# Patient Record
Sex: Female | Born: 1980 | Race: White | Hispanic: Yes | Marital: Single | Smoking: Never smoker
Health system: Southern US, Community
[De-identification: ages and names within clinical notes are randomized; demographics above are authoritative.]

---

## 2008-11-23 ENCOUNTER — Emergency Department (HOSPITAL_COMMUNITY): Admission: EM | Admit: 2008-11-23 | Discharge: 2008-11-23 | Payer: Self-pay | Admitting: Emergency Medicine

## 2008-11-28 ENCOUNTER — Emergency Department (HOSPITAL_COMMUNITY): Admission: EM | Admit: 2008-11-28 | Discharge: 2008-11-28 | Payer: Self-pay | Admitting: Emergency Medicine

## 2008-12-02 ENCOUNTER — Emergency Department (HOSPITAL_COMMUNITY): Admission: EM | Admit: 2008-12-02 | Discharge: 2008-12-02 | Payer: Self-pay | Admitting: Emergency Medicine

## 2008-12-25 ENCOUNTER — Emergency Department (HOSPITAL_COMMUNITY): Admission: EM | Admit: 2008-12-25 | Discharge: 2008-12-25 | Payer: Self-pay | Admitting: Emergency Medicine

## 2009-03-17 ENCOUNTER — Emergency Department (HOSPITAL_COMMUNITY): Admission: EM | Admit: 2009-03-17 | Discharge: 2009-03-17 | Payer: Self-pay | Admitting: Emergency Medicine

## 2009-07-16 ENCOUNTER — Emergency Department (HOSPITAL_COMMUNITY): Admission: EM | Admit: 2009-07-16 | Discharge: 2009-07-16 | Payer: Self-pay | Admitting: Emergency Medicine

## 2009-09-10 ENCOUNTER — Emergency Department (HOSPITAL_COMMUNITY): Admission: EM | Admit: 2009-09-10 | Discharge: 2009-09-10 | Payer: Self-pay | Admitting: Emergency Medicine
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
## 2011-01-22 LAB — DIFFERENTIAL
Basophils Absolute: 0 10*3/uL (ref 0.0–0.1)
Basophils Relative: 0 % (ref 0–1)
Eosinophils Absolute: 0.2 10*3/uL (ref 0.0–0.7)
Monocytes Relative: 8 % (ref 3–12)
Neutrophils Relative %: 46 % (ref 43–77)

## 2011-01-22 LAB — CBC
Hemoglobin: 14.2 g/dL (ref 12.0–15.0)
RBC: 4.59 MIL/uL (ref 3.87–5.11)

## 2011-01-22 LAB — COMPREHENSIVE METABOLIC PANEL
ALT: 19 U/L (ref 0–35)
Alkaline Phosphatase: 61 U/L (ref 39–117)
CO2: 27 mEq/L (ref 19–32)
Chloride: 102 mEq/L (ref 96–112)
GFR calc non Af Amer: >60 mL/min (ref >60)
Glucose, Bld: 100 mg/dL — ABNORMAL HIGH (ref 70–99)
Potassium: 3.8 mEq/L (ref 3.5–5.1)
Sodium: 139 mEq/L (ref 135–145)
Total Bilirubin: 0.5 mg/dL (ref 0.3–1.2)

## 2011-01-22 LAB — URINALYSIS, ROUTINE W REFLEX MICROSCOPIC
Glucose, UA: NEGATIVE mg/dL
Ketones, ur: NEGATIVE mg/dL
Protein, ur: NEGATIVE mg/dL

## 2011-01-22 LAB — LIPASE, BLOOD: Lipase: 31 U/L (ref 11–59)

## 2011-01-22 LAB — POCT PREGNANCY, URINE: Preg Test, Ur: NEGATIVE

## 2011-01-26 LAB — HEPATIC FUNCTION PANEL
ALT: 17 U/L (ref 0–35)
Albumin: 3.9 g/dL (ref 3.5–5.2)
Bilirubin, Direct: 0.1 mg/dL (ref 0.0–0.3)
Indirect Bilirubin: 0.4 mg/dL (ref 0.3–0.9)
Total Bilirubin: 0.5 mg/dL (ref 0.3–1.2)
Total Protein: 7.2 g/dL (ref 6.0–8.3)

## 2011-01-26 LAB — URINALYSIS, ROUTINE W REFLEX MICROSCOPIC
Bilirubin Urine: NEGATIVE
Nitrite: NEGATIVE
Specific Gravity, Urine: 1.02 (ref 1.005–1.030)
pH: 6.5 (ref 5.0–8.0)

## 2011-01-26 LAB — POCT I-STAT, CHEM 8
Chloride: 104 mEq/L (ref 96–112)
Glucose, Bld: 105 mg/dL — ABNORMAL HIGH (ref 70–99)
HCT: 42 % (ref 36.0–46.0)
Hemoglobin: 14.3 g/dL (ref 12.0–15.0)
Potassium: 3.7 mEq/L (ref 3.5–5.1)
Sodium: 138 mEq/L (ref 135–145)

## 2011-01-26 LAB — DIFFERENTIAL
Lymphocytes Relative: 38 % (ref 12–46)
Lymphs Abs: 4.2 10*3/uL — ABNORMAL HIGH (ref 0.7–4.0)
Monocytes Relative: 6 % (ref 3–12)
Neutro Abs: 6.1 10*3/uL (ref 1.7–7.7)
Neutrophils Relative %: 55 % (ref 43–77)

## 2011-01-26 LAB — CBC
RBC: 4.52 MIL/uL (ref 3.87–5.11)
WBC: 11.2 10*3/uL — ABNORMAL HIGH (ref 4.0–10.5)

## 2011-01-28 LAB — CBC
Hemoglobin: 12.8 g/dL (ref 12.0–15.0)
RDW: 13.6 % (ref 11.5–15.5)
WBC: 8.2 10*3/uL (ref 4.0–10.5)

## 2011-01-28 LAB — COMPREHENSIVE METABOLIC PANEL
ALT: 25 U/L (ref 0–35)
Albumin: 4 g/dL (ref 3.5–5.2)
Alkaline Phosphatase: 55 U/L (ref 39–117)
Chloride: 107 mEq/L (ref 96–112)
Glucose, Bld: 109 mg/dL — ABNORMAL HIGH (ref 70–99)
Potassium: 3.8 mEq/L (ref 3.5–5.1)
Sodium: 137 mEq/L (ref 135–145)
Total Bilirubin: 0.5 mg/dL (ref 0.3–1.2)
Total Protein: 6.4 g/dL (ref 6.0–8.3)

## 2011-01-28 LAB — URINALYSIS, ROUTINE W REFLEX MICROSCOPIC
Bilirubin Urine: NEGATIVE
Glucose, UA: NEGATIVE mg/dL
Ketones, ur: NEGATIVE mg/dL
pH: 6 (ref 5.0–8.0)

## 2011-01-28 LAB — DIFFERENTIAL
Basophils Relative: 0 % (ref 0–1)
Eosinophils Absolute: 0.1 10*3/uL (ref 0.0–0.7)
Monocytes Absolute: 0.4 10*3/uL (ref 0.1–1.0)
Monocytes Relative: 5 % (ref 3–12)
Neutrophils Relative %: 65 % (ref 43–77)

## 2011-02-02 LAB — LIPASE, BLOOD: Lipase: 28 U/L (ref 11–59)

## 2011-02-02 LAB — COMPREHENSIVE METABOLIC PANEL
BUN: 10 mg/dL (ref 6–23)
CO2: 23 mEq/L (ref 19–32)
Chloride: 102 mEq/L (ref 96–112)
Creatinine, Ser: 0.66 mg/dL (ref 0.4–1.2)
GFR calc non Af Amer: >60 mL/min (ref >60)
Glucose, Bld: 130 mg/dL — ABNORMAL HIGH (ref 70–99)
Total Bilirubin: 0.6 mg/dL (ref 0.3–1.2)

## 2011-02-02 LAB — DIFFERENTIAL
Basophils Absolute: 0.2 10*3/uL — ABNORMAL HIGH (ref 0.0–0.1)
Eosinophils Relative: 3 % (ref 0–5)
Lymphocytes Relative: 36 % (ref 12–46)
Neutro Abs: 6.4 10*3/uL (ref 1.7–7.7)

## 2011-02-02 LAB — CBC
HCT: 37.5 % (ref 36.0–46.0)
MCV: 89 fL (ref 78.0–100.0)
RBC: 4.21 MIL/uL (ref 3.87–5.11)
WBC: 11.7 10*3/uL — ABNORMAL HIGH (ref 4.0–10.5)

## 2011-02-02 LAB — URINALYSIS, ROUTINE W REFLEX MICROSCOPIC
Bilirubin Urine: NEGATIVE
Nitrite: NEGATIVE
Specific Gravity, Urine: >1.03 — ABNORMAL HIGH (ref 1.005–1.030)
Urobilinogen, UA: 0.2 mg/dL (ref 0.0–1.0)

## 2011-02-02 LAB — URINE MICROSCOPIC-ADD ON

## 2011-02-02 LAB — PREGNANCY, URINE: Preg Test, Ur: NEGATIVE

## 2014-02-25 ENCOUNTER — Observation Stay (HOSPITAL_COMMUNITY)
Admission: EM | Admit: 2014-02-25 | Discharge: 2014-02-27 | Disposition: A | Discharge status: Home / Self Care | Payer: Self-pay | Attending: General Surgery | Admitting: General Surgery

## 2014-02-25 ENCOUNTER — Emergency Department (HOSPITAL_COMMUNITY): Payer: Self-pay

## 2014-02-25 ENCOUNTER — Encounter (HOSPITAL_COMMUNITY): Payer: Self-pay | Admitting: Emergency Medicine

## 2014-02-25 DIAGNOSIS — K8020 Calculus of gallbladder without cholecystitis without obstruction: Principal | ICD-10-CM | POA: Insufficient documentation

## 2014-02-25 DIAGNOSIS — K8050 Calculus of bile duct without cholangitis or cholecystitis without obstruction: Secondary | ICD-10-CM | POA: Diagnosis present

## 2014-02-25 DIAGNOSIS — R109 Unspecified abdominal pain: Secondary | ICD-10-CM

## 2014-02-25 LAB — CBC WITH DIFFERENTIAL/PLATELET
Basophils Absolute: 0 10*3/uL (ref 0.0–0.1)
Basophils Relative: 0 % (ref 0–1)
Eosinophils Absolute: 0.1 10*3/uL (ref 0.0–0.7)
Eosinophils Relative: 1 % (ref 0–5)
HEMATOCRIT: 39.3 % (ref 36.0–46.0)
HEMOGLOBIN: 13.5 g/dL (ref 12.0–15.0)
LYMPHS ABS: 3.5 10*3/uL (ref 0.7–4.0)
LYMPHS PCT: 32 % (ref 12–46)
MCH: 30.5 pg (ref 26.0–34.0)
MCHC: 34.4 g/dL (ref 30.0–36.0)
MCV: 88.9 fL (ref 78.0–100.0)
MONO ABS: 0.5 10*3/uL (ref 0.1–1.0)
MONOS PCT: 5 % (ref 3–12)
NEUTROS ABS: 6.9 10*3/uL (ref 1.7–7.7)
Neutrophils Relative %: 62 % (ref 43–77)
Platelets: 362 10*3/uL (ref 150–400)
RBC: 4.42 MIL/uL (ref 3.87–5.11)
RDW: 13.6 % (ref 11.5–15.5)
WBC: 11.1 10*3/uL — AB (ref 4.0–10.5)

## 2014-02-25 LAB — COMPREHENSIVE METABOLIC PANEL
ALBUMIN: 4.4 g/dL (ref 3.5–5.2)
ALT: 14 U/L (ref 0–35)
AST: 21 U/L (ref 0–37)
Alkaline Phosphatase: 74 U/L (ref 39–117)
BUN: 18 mg/dL (ref 6–23)
CALCIUM: 9.4 mg/dL (ref 8.4–10.5)
CO2: 26 mEq/L (ref 19–32)
Chloride: 102 mEq/L (ref 96–112)
Creatinine, Ser: 0.78 mg/dL (ref 0.50–1.10)
GFR calc non Af Amer: >90 mL/min (ref >90)
GLUCOSE: 108 mg/dL — AB (ref 70–99)
Potassium: 3.9 mEq/L (ref 3.7–5.3)
SODIUM: 141 meq/L (ref 137–147)
TOTAL PROTEIN: 7.8 g/dL (ref 6.0–8.3)
Total Bilirubin: <0.2 mg/dL — ABNORMAL LOW (ref 0.3–1.2)

## 2014-02-25 LAB — LIPASE, BLOOD: Lipase: 22 U/L (ref 11–59)

## 2014-02-25 LAB — POC URINE PREG, ED: PREG TEST UR: NEGATIVE

## 2014-02-25 LAB — I-STAT TROPONIN, ED: Troponin i, poc: 0 ng/mL (ref 0.00–0.08)

## 2014-02-25 MED ORDER — HYDROMORPHONE HCL PF 1 MG/ML IJ SOLN
1.0000 mg | Freq: Once | INTRAMUSCULAR | Status: AC
  Administered 2014-02-25: 1 mg via INTRAVENOUS
  Filled 2014-02-25: qty 1

## 2014-02-25 MED ORDER — SODIUM CHLORIDE 0.9 % IV BOLUS (SEPSIS)
1000.0000 mL | Freq: Once | INTRAVENOUS | Status: AC
  Administered 2014-02-25: 1000 mL via INTRAVENOUS

## 2014-02-25 MED ORDER — FENTANYL CITRATE 0.05 MG/ML IJ SOLN
INTRAMUSCULAR | Status: AC
  Filled 2014-02-25: qty 2

## 2014-02-25 MED ORDER — FENTANYL CITRATE 0.05 MG/ML IJ SOLN
50.0000 ug | Freq: Once | INTRAMUSCULAR | Status: AC
  Administered 2014-02-25: 50 ug via NASAL

## 2014-02-25 MED ORDER — ONDANSETRON HCL 4 MG/2ML IJ SOLN
4.0000 mg | Freq: Once | INTRAMUSCULAR | Status: AC
  Administered 2014-02-25: 4 mg via INTRAVENOUS
  Filled 2014-02-25: qty 2

## 2014-02-25 NOTE — ED Notes (Signed)
EDP and resident at bedside performing ultrasound on gallbladder. Pt has multiple stones in gallbladder.

## 2014-02-25 NOTE — ED Provider Notes (Signed)
CSN: [id number]     Arrival date & time 02/25/14  2022 History   First MD Initiated Contact with Patient 02/25/14 2141     Chief Complaint  Patient presents with  . Abdominal Pain  . Emesis  . Hematemesis     (Consider location/radiation/quality/duration/timing/severity/associated sxs/prior Treatment) HPI Comments: 33 year old female with gallstone history presents with worsening right upper abdominal pain since earlier today. Pain similar to previous however more severe. Pain is now constant initially intermittent. No fevers however patient has been vomiting with mild blood clots in it. Patient does take NSAIDs for pain at times however no known ulcer history. Pain radiates into her chest. No history of cardiac or pulmonary embolism. Pain is worse after eating especially fatty foods.  Patient is a 33 y.o. female presenting with abdominal pain and vomiting. The history is provided by the patient.  Abdominal Pain Associated symptoms: chest pain, chills, nausea and vomiting   Associated symptoms: no diarrhea, no dysuria, no fever and no shortness of breath   Emesis Associated symptoms: abdominal pain and chills   Associated symptoms: no diarrhea and no headaches     History reviewed. No pertinent past medical history. History reviewed. No pertinent past surgical history. History reviewed. No pertinent family history. History  Substance Use Topics  . Smoking status: Never Smoker   . Smokeless tobacco: Not on file  . Alcohol Use: No   OB History   Grav Para Term Preterm Abortions TAB SAB Ect Mult Living                 Review of Systems  Constitutional: Positive for chills and appetite change. Negative for fever.  HENT: Negative for congestion.   Eyes: Negative for visual disturbance.  Respiratory: Negative for shortness of breath.   Cardiovascular: Positive for chest pain.  Gastrointestinal: Positive for nausea, vomiting and abdominal pain. Negative for diarrhea.   Genitourinary: Negative for dysuria and flank pain.  Musculoskeletal: Negative for back pain, neck pain and neck stiffness.  Skin: Negative for rash.  Neurological: Positive for light-headedness. Negative for headaches.      Allergies  Review of patient's allergies indicates no known allergies.  Home Medications   Prior to Admission medications   Medication Sig Start Date End Date Taking? Authorizing Provider  indomethacin (INDOCIN) 25 MG capsule Take 25 mg by mouth 3 (three) times daily as needed for mild pain.    Yes Historical Provider, MD   BP 149/89  Pulse 89  Temp(Src) 97.9 F (36.6 C) (Oral)  Resp 22  SpO2 98%  LMP 02/24/2014 Physical Exam  Nursing note and vitals reviewed. Constitutional: She is oriented to person, place, and time. She appears well-developed and well-nourished.  HENT:  Head: Normocephalic and atraumatic.  Mild dry mucous membranes  Eyes: Conjunctivae are normal. Right eye exhibits no discharge. Left eye exhibits no discharge.  Neck: Normal range of motion. Neck supple. No tracheal deviation present.  Cardiovascular: Normal rate and regular rhythm.   Pulmonary/Chest: Effort normal and breath sounds normal.  Abdominal: Soft. She exhibits no distension. There is tenderness. There is no guarding.  Musculoskeletal: She exhibits no edema.  Neurological: She is alert and oriented to person, place, and time.  Skin: Skin is warm. No rash noted.  Psychiatric: She has a normal mood and affect.    ED Course  Procedures (including critical care time)   EMERGENCY DEPARTMENT BILIARY ULTRASOUND INTERPRETATION "Study: Limited Abdominal Ultrasound of the gallbladder and common bile duct."  INDICATIONS: Abdominal  pain, RUQ pain, Nausea and Vomiting Indication: Multiple views of the gallbladder and common bile duct were obtained in real-time with a Multi-frequency probe." PERFORMED BY:  Myself IMAGES ARCHIVED?: Yes FINDINGS: Gallstones present, Gallbladder  enlarged and Sonographic Murphy's sign present LIMITATIONS: Body Habitus, Bowel Gas and Abdominal pain INTERPRETATION: Cholelithiasis   Labs Review Labs Reviewed  COMPREHENSIVE METABOLIC PANEL - Abnormal; Notable for the following:    Glucose, Bld 108 (*)    Total Bilirubin <0.2 (*)    All other components within normal limits  CBC WITH DIFFERENTIAL - Abnormal; Notable for the following:    WBC 11.1 (*)    All other components within normal limits  LIPASE, BLOOD  URINALYSIS, ROUTINE W REFLEX MICROSCOPIC  I-STAT TROPOININ, ED  POC URINE PREG, ED    Imaging Review No results found.   EKG Interpretation   Date/Time:  Monday Feb 25 2014 20:50:13 EDT Ventricular Rate:  89 PR Interval:  188 QRS Duration: 82 QT Interval:  358 QTC Calculation: 435 R Axis:   62 Text Interpretation:  Normal sinus rhythm Normal ECG Confirmed by Hyde Sires   MD, Antara Brecheisen ([id number]) on 02/25/2014 9:54:39 PM      MDM   Final diagnoses:  None   Clinically biliary colic versus cholecystitis versus ulcer/gastritis. Bedside ultrasound showed significant gallstones/wall echogenic complex. Difficult to assess wall thickness due to the extent of stones. Very mild white blood cell count elevation. Urine pending. Repeat pain meds and IV fluids given. Page surgery to discuss findings. Formal ultrasound showed to obtain more detail in for surgery consult.  Surgery consulted for admission  Cholelithiasis, Biliary colic   Enid SkeensJoshua M Tejon Gracie, MD 03/04/14 [phone]

## 2014-02-25 NOTE — ED Notes (Addendum)
Pt reports 8/10 RUQ pain x AM with N/V x 1 (reports seeing blood in it).  Denies hematuria, but reports dysuria. Denies fever/chills. Pt denies nausea at this time. Pt appears anxious, diaphoretic. Hx: gallbladder stones.

## 2014-02-25 NOTE — ED Notes (Signed)
Pt states right upper quadrant pain. Pt states that she has been having pain after she eats and it radiates up her right shoulder. Pt tender upon palpation of RUQ.

## 2014-02-26 ENCOUNTER — Observation Stay (HOSPITAL_COMMUNITY): Payer: Self-pay | Admitting: Certified Registered Nurse Anesthetist

## 2014-02-26 ENCOUNTER — Encounter (HOSPITAL_COMMUNITY)
Admission: EM | Disposition: A | Discharge status: Home / Self Care | Payer: Self-pay | Source: Home / Self Care | Attending: Emergency Medicine

## 2014-02-26 ENCOUNTER — Encounter (HOSPITAL_COMMUNITY): Payer: Self-pay | Admitting: Certified Registered Nurse Anesthetist

## 2014-02-26 ENCOUNTER — Encounter (HOSPITAL_COMMUNITY): Payer: Self-pay | Admitting: Emergency Medicine

## 2014-02-26 DIAGNOSIS — K8020 Calculus of gallbladder without cholecystitis without obstruction: Secondary | ICD-10-CM

## 2014-02-26 DIAGNOSIS — K8050 Calculus of bile duct without cholangitis or cholecystitis without obstruction: Secondary | ICD-10-CM | POA: Diagnosis present

## 2014-02-26 DIAGNOSIS — K8010 Calculus of gallbladder with chronic cholecystitis without obstruction: Secondary | ICD-10-CM

## 2014-02-26 DIAGNOSIS — R1011 Right upper quadrant pain: Secondary | ICD-10-CM

## 2014-02-26 HISTORY — PX: CHOLECYSTECTOMY: SHX55

## 2014-02-26 LAB — CBC
HCT: 35.9 % — ABNORMAL LOW (ref 36.0–46.0)
Hemoglobin: 11.9 g/dL — ABNORMAL LOW (ref 12.0–15.0)
MCH: 29.9 pg (ref 26.0–34.0)
MCHC: 33.1 g/dL (ref 30.0–36.0)
MCV: 90.2 fL (ref 78.0–100.0)
PLATELETS: 283 10*3/uL (ref 150–400)
RBC: 3.98 MIL/uL (ref 3.87–5.11)
RDW: 13.7 % (ref 11.5–15.5)
WBC: 7.7 10*3/uL (ref 4.0–10.5)

## 2014-02-26 LAB — URINALYSIS, ROUTINE W REFLEX MICROSCOPIC
BILIRUBIN URINE: NEGATIVE
Glucose, UA: NEGATIVE mg/dL
Hgb urine dipstick: NEGATIVE
Ketones, ur: NEGATIVE mg/dL
LEUKOCYTES UA: NEGATIVE
NITRITE: NEGATIVE
Protein, ur: NEGATIVE mg/dL
SPECIFIC GRAVITY, URINE: 1.028 (ref 1.005–1.030)
UROBILINOGEN UA: 1 mg/dL (ref 0.0–1.0)
pH: 8 (ref 5.0–8.0)

## 2014-02-26 LAB — COMPREHENSIVE METABOLIC PANEL
ALK PHOS: 55 U/L (ref 39–117)
ALT: 11 U/L (ref 0–35)
AST: 12 U/L (ref 0–37)
Albumin: 3.2 g/dL — ABNORMAL LOW (ref 3.5–5.2)
BILIRUBIN TOTAL: 0.2 mg/dL — AB (ref 0.3–1.2)
BUN: 14 mg/dL (ref 6–23)
CHLORIDE: 107 meq/L (ref 96–112)
CO2: 23 meq/L (ref 19–32)
Calcium: 8.2 mg/dL — ABNORMAL LOW (ref 8.4–10.5)
Creatinine, Ser: 0.7 mg/dL (ref 0.50–1.10)
GFR calc Af Amer: >90 mL/min (ref >90)
Glucose, Bld: 111 mg/dL — ABNORMAL HIGH (ref 70–99)
Potassium: 3.8 mEq/L (ref 3.7–5.3)
Sodium: 141 mEq/L (ref 137–147)
Total Protein: 5.9 g/dL — ABNORMAL LOW (ref 6.0–8.3)

## 2014-02-26 SURGERY — LAPAROSCOPIC CHOLECYSTECTOMY
Anesthesia: General | Site: Abdomen

## 2014-02-26 MED ORDER — FENTANYL CITRATE 0.05 MG/ML IJ SOLN
INTRAMUSCULAR | Status: DC | PRN
  Administered 2014-02-26: 50 ug via INTRAVENOUS
  Administered 2014-02-26: 100 ug via INTRAVENOUS
  Administered 2014-02-26 (×2): 50 ug via INTRAVENOUS

## 2014-02-26 MED ORDER — CIPROFLOXACIN IN D5W 400 MG/200ML IV SOLN
400.0000 mg | INTRAVENOUS | Status: DC
  Filled 2014-02-26: qty 200

## 2014-02-26 MED ORDER — KCL IN DEXTROSE-NACL 20-5-0.9 MEQ/L-%-% IV SOLN
INTRAVENOUS | Status: DC
  Administered 2014-02-26 (×2): via INTRAVENOUS
  Administered 2014-02-27: 100 mL/h via INTRAVENOUS
  Filled 2014-02-26 (×5): qty 1000

## 2014-02-26 MED ORDER — HYDROMORPHONE HCL PF 1 MG/ML IJ SOLN
0.2500 mg | INTRAMUSCULAR | Status: DC | PRN
  Administered 2014-02-26 (×2): 0.5 mg via INTRAVENOUS

## 2014-02-26 MED ORDER — MIDAZOLAM HCL 2 MG/2ML IJ SOLN
INTRAMUSCULAR | Status: AC
  Filled 2014-02-26: qty 2

## 2014-02-26 MED ORDER — BUPIVACAINE-EPINEPHRINE (PF) 0.25% -1:200000 IJ SOLN
INTRAMUSCULAR | Status: AC
  Filled 2014-02-26: qty 30

## 2014-02-26 MED ORDER — PROPOFOL 10 MG/ML IV BOLUS
INTRAVENOUS | Status: AC
  Filled 2014-02-26: qty 20

## 2014-02-26 MED ORDER — OXYCODONE HCL 5 MG PO TABS
ORAL_TABLET | ORAL | Status: AC
  Filled 2014-02-26: qty 1

## 2014-02-26 MED ORDER — ROCURONIUM BROMIDE 50 MG/5ML IV SOLN
INTRAVENOUS | Status: AC
  Filled 2014-02-26: qty 1

## 2014-02-26 MED ORDER — ENOXAPARIN SODIUM 40 MG/0.4ML ~~LOC~~ SOLN
40.0000 mg | SUBCUTANEOUS | Status: DC
  Filled 2014-02-26 (×2): qty 0.4

## 2014-02-26 MED ORDER — ONDANSETRON HCL 4 MG/2ML IJ SOLN: 4.0000 mg | Freq: Four times a day (QID) | INTRAMUSCULAR | Status: DC | PRN

## 2014-02-26 MED ORDER — ONDANSETRON HCL 4 MG/2ML IJ SOLN
INTRAMUSCULAR | Status: DC | PRN
  Administered 2014-02-26: 4 mg via INTRAVENOUS

## 2014-02-26 MED ORDER — CIPROFLOXACIN IN D5W 400 MG/200ML IV SOLN
INTRAVENOUS | Status: DC | PRN
  Administered 2014-02-26: 400 mg via INTRAVENOUS

## 2014-02-26 MED ORDER — LACTATED RINGERS IV SOLN
INTRAVENOUS | Status: DC | PRN
  Administered 2014-02-26: 11:00:00 via INTRAVENOUS

## 2014-02-26 MED ORDER — ONDANSETRON HCL 4 MG/2ML IJ SOLN
INTRAMUSCULAR | Status: AC
  Filled 2014-02-26: qty 2

## 2014-02-26 MED ORDER — MIDAZOLAM HCL 5 MG/5ML IJ SOLN
INTRAMUSCULAR | Status: DC | PRN
  Administered 2014-02-26: 2 mg via INTRAVENOUS

## 2014-02-26 MED ORDER — HYDROMORPHONE HCL PF 1 MG/ML IJ SOLN
1.0000 mg | INTRAMUSCULAR | Status: DC | PRN
  Administered 2014-02-26 – 2014-02-27 (×3): 1 mg via INTRAVENOUS
  Filled 2014-02-26 (×3): qty 1

## 2014-02-26 MED ORDER — CIPROFLOXACIN IN D5W 400 MG/200ML IV SOLN
400.0000 mg | Freq: Two times a day (BID) | INTRAVENOUS | Status: DC
  Administered 2014-02-26 – 2014-02-27 (×3): 400 mg via INTRAVENOUS
  Filled 2014-02-26 (×6): qty 200

## 2014-02-26 MED ORDER — PANTOPRAZOLE SODIUM 40 MG IV SOLR
40.0000 mg | Freq: Every day | INTRAVENOUS | Status: DC
Start: 2014-02-26 — End: 2014-02-27
  Administered 2014-02-26: 40 mg via INTRAVENOUS
  Filled 2014-02-26 (×2): qty 40

## 2014-02-26 MED ORDER — LIDOCAINE HCL (CARDIAC) 20 MG/ML IV SOLN
INTRAVENOUS | Status: AC
  Filled 2014-02-26: qty 5

## 2014-02-26 MED ORDER — SODIUM CHLORIDE 0.9 % IR SOLN
Status: DC | PRN
  Administered 2014-02-26: 1000 mL

## 2014-02-26 MED ORDER — LACTATED RINGERS IV SOLN
INTRAVENOUS | Status: DC
  Administered 2014-02-26: 11:00:00 via INTRAVENOUS

## 2014-02-26 MED ORDER — NEOSTIGMINE METHYLSULFATE 10 MG/10ML IV SOLN
INTRAVENOUS | Status: DC | PRN
  Administered 2014-02-26: 4 mg via INTRAVENOUS

## 2014-02-26 MED ORDER — OXYCODONE HCL 5 MG/5ML PO SOLN: 5.0000 mg | Freq: Once | ORAL | Status: AC | PRN

## 2014-02-26 MED ORDER — LIDOCAINE HCL (CARDIAC) 20 MG/ML IV SOLN
INTRAVENOUS | Status: DC | PRN
  Administered 2014-02-26: 100 mg via INTRAVENOUS

## 2014-02-26 MED ORDER — BUPIVACAINE-EPINEPHRINE 0.25% -1:200000 IJ SOLN
INTRAMUSCULAR | Status: DC | PRN
  Administered 2014-02-26: 30 mL

## 2014-02-26 MED ORDER — FENTANYL CITRATE 0.05 MG/ML IJ SOLN
INTRAMUSCULAR | Status: AC
  Filled 2014-02-26: qty 5

## 2014-02-26 MED ORDER — ROCURONIUM BROMIDE 100 MG/10ML IV SOLN
INTRAVENOUS | Status: DC | PRN
  Administered 2014-02-26: 50 mg via INTRAVENOUS

## 2014-02-26 MED ORDER — 0.9 % SODIUM CHLORIDE (POUR BTL) OPTIME
TOPICAL | Status: DC | PRN
  Administered 2014-02-26: 1000 mL

## 2014-02-26 MED ORDER — PROPOFOL 10 MG/ML IV BOLUS
INTRAVENOUS | Status: DC | PRN
  Administered 2014-02-26: 180 mg via INTRAVENOUS

## 2014-02-26 MED ORDER — GLYCOPYRROLATE 0.2 MG/ML IJ SOLN
INTRAMUSCULAR | Status: DC | PRN
  Administered 2014-02-26: .6 mg via INTRAVENOUS

## 2014-02-26 MED ORDER — HYDROMORPHONE HCL PF 1 MG/ML IJ SOLN
INTRAMUSCULAR | Status: AC
  Filled 2014-02-26: qty 1

## 2014-02-26 MED ORDER — OXYCODONE HCL 5 MG PO TABS
5.0000 mg | ORAL_TABLET | Freq: Once | ORAL | Status: AC | PRN
  Administered 2014-02-26: 5 mg via ORAL

## 2014-02-26 SURGICAL SUPPLY — 40 items
APPLIER CLIP 5 13 M/L LIGAMAX5 (MISCELLANEOUS) ×3
BLADE SURG ROTATE 9660 (MISCELLANEOUS) IMPLANT
CANISTER SUCTION 2500CC (MISCELLANEOUS) ×3 IMPLANT
CHLORAPREP W/TINT 26ML (MISCELLANEOUS) ×3 IMPLANT
CLIP APPLIE 5 13 M/L LIGAMAX5 (MISCELLANEOUS) ×1 IMPLANT
COVER MAYO STAND STRL (DRAPES) IMPLANT
COVER SURGICAL LIGHT HANDLE (MISCELLANEOUS) ×3 IMPLANT
DECANTER SPIKE VIAL GLASS SM (MISCELLANEOUS) IMPLANT
DERMABOND ADVANCED (GAUZE/BANDAGES/DRESSINGS) ×2
DERMABOND ADVANCED .7 DNX12 (GAUZE/BANDAGES/DRESSINGS) ×1 IMPLANT
DRAPE C-ARM 42X72 X-RAY (DRAPES) IMPLANT
ELECT REM PT RETURN 9FT ADLT (ELECTROSURGICAL) ×3
ELECTRODE REM PT RTRN 9FT ADLT (ELECTROSURGICAL) ×1 IMPLANT
GLOVE BIO SURGEON STRL SZ 6.5 (GLOVE) ×6 IMPLANT
GLOVE BIO SURGEON STRL SZ7 (GLOVE) ×3 IMPLANT
GLOVE BIO SURGEONS STRL SZ 6.5 (GLOVE) ×3
GLOVE BIOGEL PI IND STRL 7.0 (GLOVE) ×1 IMPLANT
GLOVE BIOGEL PI IND STRL 7.5 (GLOVE) ×1 IMPLANT
GLOVE BIOGEL PI INDICATOR 7.0 (GLOVE) ×2
GLOVE BIOGEL PI INDICATOR 7.5 (GLOVE) ×2
GLOVE SURG SS PI 7.0 STRL IVOR (GLOVE) ×3 IMPLANT
GOWN STRL REUS W/ TWL LRG LVL3 (GOWN DISPOSABLE) ×4 IMPLANT
GOWN STRL REUS W/TWL LRG LVL3 (GOWN DISPOSABLE) ×8
KIT BASIN OR (CUSTOM PROCEDURE TRAY) ×3 IMPLANT
KIT ROOM TURNOVER OR (KITS) ×3 IMPLANT
NS IRRIG 1000ML POUR BTL (IV SOLUTION) ×3 IMPLANT
PAD ARMBOARD 7.5X6 YLW CONV (MISCELLANEOUS) ×3 IMPLANT
POUCH RETRIEVAL ECOSAC 10 (ENDOMECHANICALS) ×1 IMPLANT
POUCH RETRIEVAL ECOSAC 10MM (ENDOMECHANICALS) ×2
SCISSORS LAP 5X35 DISP (ENDOMECHANICALS) ×3 IMPLANT
SET CHOLANGIOGRAPH 5 50 .035 (SET/KITS/TRAYS/PACK) IMPLANT
SET IRRIG TUBING LAPAROSCOPIC (IRRIGATION / IRRIGATOR) ×3 IMPLANT
SLEEVE ENDOPATH XCEL 5M (ENDOMECHANICALS) ×6 IMPLANT
SPECIMEN JAR SMALL (MISCELLANEOUS) ×3 IMPLANT
SUT MNCRL AB 4-0 PS2 18 (SUTURE) ×3 IMPLANT
TOWEL OR 17X24 6PK STRL BLUE (TOWEL DISPOSABLE) IMPLANT
TOWEL OR 17X26 10 PK STRL BLUE (TOWEL DISPOSABLE) ×3 IMPLANT
TRAY LAPAROSCOPIC (CUSTOM PROCEDURE TRAY) ×3 IMPLANT
TROCAR XCEL BLUNT TIP 100MML (ENDOMECHANICALS) ×3 IMPLANT
TROCAR XCEL NON-BLD 5MMX100MML (ENDOMECHANICALS) ×3 IMPLANT

## 2014-02-26 NOTE — H&P (Signed)
Lauren Mcgrath is an 33 y.o. female.   Chief Complaint: abdominal pain HPI: 1 day hx RUQ pain sharp severe with known history of gallstones. Translator used.  Pain constant not improved with pain meds.  U/S shows multiple gallstones without GB wall thickening.   History reviewed. No pertinent past medical history.  History reviewed. No pertinent past surgical history.  History reviewed. No pertinent family history. Social History:  reports that she has never smoked. She does not have any smokeless tobacco history on file. She reports that she does not drink alcohol or use illicit drugs.  Allergies: No Known Allergies   (Not in a hospital admission)  Results for orders placed during the hospital encounter of 02/25/14 (from the past 48 hour(s))  COMPREHENSIVE METABOLIC PANEL     Status: Abnormal   Collection Time    02/25/14  8:42 PM      Result Value Ref Range   Sodium 141  137 - 147 mEq/L   Potassium 3.9  3.7 - 5.3 mEq/L   Chloride 102  96 - 112 mEq/L   CO2 26  19 - 32 mEq/L   Glucose, Bld 108 (*) 70 - 99 mg/dL   BUN 18  6 - 23 mg/dL   Creatinine, Ser 0.78  0.50 - 1.10 mg/dL   Calcium 9.4  8.4 - 10.5 mg/dL   Total Protein 7.8  6.0 - 8.3 g/dL   Albumin 4.4  3.5 - 5.2 g/dL   AST 21  0 - 37 U/L   ALT 14  0 - 35 U/L   Alkaline Phosphatase 74  39 - 117 U/L   Total Bilirubin <0.2 (*) 0.3 - 1.2 mg/dL   GFR calc non Af Amer >90  >90 mL/min   GFR calc Af Amer >90  >90 mL/min   Comment: (NOTE)     The eGFR has been calculated using the CKD EPI equation.     This calculation has not been validated in all clinical situations.     eGFR's persistently <90 mL/min signify possible Chronic Kidney     Disease.  CBC WITH DIFFERENTIAL     Status: Abnormal   Collection Time    02/25/14  8:42 PM      Result Value Ref Range   WBC 11.1 (*) 4.0 - 10.5 K/uL   RBC 4.42  3.87 - 5.11 MIL/uL   Hemoglobin 13.5  12.0 - 15.0 g/dL   HCT 39.3  36.0 - 46.0 %   MCV 88.9  78.0 - 100.0 fL   MCH 30.5  26.0 -  34.0 pg   MCHC 34.4  30.0 - 36.0 g/dL   RDW 13.6  11.5 - 15.5 %   Platelets 362  150 - 400 K/uL   Neutrophils Relative % 62  43 - 77 %   Neutro Abs 6.9  1.7 - 7.7 K/uL   Lymphocytes Relative 32  12 - 46 %   Lymphs Abs 3.5  0.7 - 4.0 K/uL   Monocytes Relative 5  3 - 12 %   Monocytes Absolute 0.5  0.1 - 1.0 K/uL   Eosinophils Relative 1  0 - 5 %   Eosinophils Absolute 0.1  0.0 - 0.7 K/uL   Basophils Relative 0  0 - 1 %   Basophils Absolute 0.0  0.0 - 0.1 K/uL  LIPASE, BLOOD     Status: None   Collection Time    02/25/14  8:42 PM      Result Value Ref  Range   Lipase 22  11 - 59 U/L  Randolm Idol, ED     Status: None   Collection Time    02/25/14  9:27 PM      Result Value Ref Range   Troponin i, poc 0.00  0.00 - 0.08 ng/mL   Comment 3            Comment: Due to the release kinetics of cTnI,     a negative result within the first hours     of the onset of symptoms does not rule out     myocardial infarction with certainty.     If myocardial infarction is still suspected,     repeat the test at appropriate intervals.  URINALYSIS, ROUTINE W REFLEX MICROSCOPIC     Status: Abnormal   Collection Time    02/25/14 11:39 PM      Result Value Ref Range   Color, Urine YELLOW  YELLOW   APPearance HAZY (*) CLEAR   Specific Gravity, Urine 1.028  1.005 - 1.030   pH 8.0  5.0 - 8.0   Glucose, UA NEGATIVE  NEGATIVE mg/dL   Hgb urine dipstick NEGATIVE  NEGATIVE   Bilirubin Urine NEGATIVE  NEGATIVE   Ketones, ur NEGATIVE  NEGATIVE mg/dL   Protein, ur NEGATIVE  NEGATIVE mg/dL   Urobilinogen, UA 1.0  0.0 - 1.0 mg/dL   Nitrite NEGATIVE  NEGATIVE   Leukocytes, UA NEGATIVE  NEGATIVE   Comment: MICROSCOPIC NOT DONE ON URINES WITH NEGATIVE PROTEIN, BLOOD, LEUKOCYTES, NITRITE, OR GLUCOSE <1000 mg/dL.  POC URINE PREG, ED     Status: None   Collection Time    02/25/14 11:46 PM      Result Value Ref Range   Preg Test, Ur NEGATIVE  NEGATIVE   Comment:            THE SENSITIVITY OF THIS      METHODOLOGY IS >24 mIU/mL   US Abdomen Limited Ruq  02/25/2014   CLINICAL DATA:  Right upper quadrant pain.  History of gallstones.  EXAM: US ABDOMEN LIMITED - RIGHT UPPER QUADRANT  COMPARISON:  US ABDOMEN COMPLETE dated 03/17/2009  FINDINGS: Gallbladder:  Multiple stones fill the gallbladder resulting in wall echo shadow complex. No gallbladder wall thickening. Murphy's sign is negative. Cholelithiasis has progressed since prior study.  Common bile duct:  Diameter: 7.3 mm, upper limits of normal. Distal duct is not demonstrated.  Liver:  No focal lesion identified. Within normal limits in parenchymal echogenicity.  IMPRESSION: Multiple gallstones without wall thickening or edema. Murphy's sign negative. Cholelithiasis has progressed since prior study.   Electronically Signed   By: Lucienne Capers M.D.   On: 02/25/2014 23:36    ROS  Blood pressure 127/73, pulse 69, temperature 97.9 F (36.6 C), temperature source Oral, resp. rate 22, last menstrual period 02/24/2014, SpO2 98.00%. Physical Exam  Constitutional: She is oriented to person, place, and time. She appears well-developed and well-nourished.  HENT:  Head: Normocephalic.  Eyes: Pupils are equal, round, and reactive to light. No scleral icterus.  Neck: Normal range of motion. Neck supple.  Cardiovascular: Normal rate and regular rhythm.   Respiratory: Effort normal and breath sounds normal.  GI: There is tenderness in the right upper quadrant. There is negative Murphy's sign.  Musculoskeletal: Normal range of motion.  Neurological: She is alert and oriented to person, place, and time.  Skin: Skin is warm and dry.  Psychiatric: She has a normal mood and affect. Her  behavior is normal. Judgment and thought content normal.     Assessment/Plan Biliary colic vs acute cholecystitis Pain not controlled so pt will be admitted tonight for pain control and IV cipro 400 mg IV BID.  Will be seen by DOW service in am and timing of cholecystectomy  determined.  Discussed with the help of phone translator in [place].    Thomas A. Cornett 02/26/2014, 12:26 AM

## 2014-02-26 NOTE — Progress Notes (Signed)
Interpreter Wyvonnia DuskyGraciela Namihira short stay surgical

## 2014-02-26 NOTE — Anesthesia Preprocedure Evaluation (Signed)
Anesthesia Evaluation  Patient identified by MRN, date of birth, ID band Patient awake    Reviewed: Allergy & Precautions, H&P , NPO status , Patient's Chart, lab work & pertinent test results  Airway Mallampati: II   Neck ROM: full    Dental   Pulmonary neg pulmonary ROS,          Cardiovascular negative cardio ROS      Neuro/Psych    GI/Hepatic   Endo/Other    Renal/GU      Musculoskeletal   Abdominal   Peds  Hematology   Anesthesia Other Findings   Reproductive/Obstetrics                             Anesthesia Physical Anesthesia Plan  ASA: I  Anesthesia Plan: General   Post-op Pain Management:    Induction: Intravenous  Airway Management Planned: Oral ETT  Additional Equipment:   Intra-op Plan:   Post-operative Plan: Extubation in OR  Informed Consent: I have reviewed the patients History and Physical, chart, labs and discussed the procedure including the risks, benefits and alternatives for the proposed anesthesia with the patient or authorized representative who has indicated his/her understanding and acceptance.     Plan Discussed with: CRNA, Anesthesiologist and Surgeon  Anesthesia Plan Comments:         Anesthesia Quick Evaluation  

## 2014-02-26 NOTE — Op Note (Signed)
Preoperative diagnosis: Acute cholecystitis  Postoperative diagnosis: Same as above  Procedure: Laparoscopic cholecystectomy  Surgeon: Dr. Harden MoMatt Erianna Jolly  Anesthesia: Gen.  Estimated blood loss: Minimal  Drains: None  Specimens: Gallbladder and contents to pathology  Complications: None  Sponge needle count was correct completion  Disposition to recovery in stable condition  Indications: This is a 33 yo female who was admitted for acute cholecystitis. We discussed laparoscopic cholecystectomy with risks and benefits associated with that.   Procedure: After informed consent was obtained the patient was taken to the operating room. She was given antibiotics on the floor. Sequential compression devices were on her legs. She was placed under general anesthesia without complication. Her abdomen was prepped and draped in the standard sterile surgical fashion. A surgical timeout was then performed.   Her stomach was evacuated via an orogastric tube. I made a vertical incision below the umbilicus. I incised the fascia and entered the peritoneum bluntly. I placed a 0 vicryl pursestring suture through the fascia. I then inserted a hasson trocar and insufflated the abdomen to 15 mm Hg pressure. I then inserted 3 further 5 mm trocars in the epigastrium and right upper quadrant without complication. The gallbladder was known noted to have acute cholecystitis. It had a lot of inflammation and the omentum was adherent. She had a lot of adhesions from liver to abdominal wall that I had to sharply lyse because I was unable to retract the liver. I retracted the cephalad and lateral. I then dissected in the triangle to obtain the critical view of safety. I then clipped the duct 3 times and left 2 clips in place. The duct was viable and my clips were completely across the duct. I treated the artery a similar fashion. This was also divided. I then removed the gallbladder from the liver bed without difficulty. This was  placed in a bag and removed from the umbilicus. Irrigation was performed. I obtained hemostasis.I then removed the umbilical trocar and tied down my pursestring. I then desufflated the abdomen and removed my remaining trocars. These were all closed with 4-0 Monocryl and Dermabond. She tolerated this well was extubated and transferred to the recovery room stable.

## 2014-02-26 NOTE — Anesthesia Postprocedure Evaluation (Signed)
Anesthesia Post Note  Patient: Lauren Mcgrath  Procedure(s) Performed: Procedure(s) (LRB): LAPAROSCOPIC CHOLECYSTECTOMY (N/A)  Anesthesia type: General  Patient location: PACU  Post pain: Pain level controlled and Adequate analgesia  Post assessment: Post-op Vital signs reviewed, Patient's Cardiovascular Status Stable, Respiratory Function Stable, Patent Airway and Pain level controlled  Last Vitals:  Filed Vitals:   02/26/14 1333  BP:   Pulse: 64  Temp:   Resp: 15    Post vital signs: Reviewed and stable  Level of consciousness: awake, alert  and oriented  Complications: No apparent anesthesia complications

## 2014-02-26 NOTE — Transfer of Care (Signed)
Immediate Anesthesia Transfer of Care Note  Patient: Lauren Mcgrath  Procedure(s) Performed: Procedure(s): LAPAROSCOPIC CHOLECYSTECTOMY (N/A)  Patient Location: PACU  Anesthesia Type:General  Level of Consciousness: awake, alert  and oriented  Airway & Oxygen Therapy: Patient Spontanous Breathing  Post-op Assessment: Report given to PACU RN  Post vital signs: Reviewed and stable  Complications: No apparent anesthesia complications

## 2014-02-26 NOTE — Progress Notes (Signed)
  Subjective: Feels better this am  Objective: Vital signs in last 24 hours: Temp:  [97.7 F (36.5 C)-97.9 F (36.6 C)] 97.7 F (36.5 C) (05/12 0607) Pulse Rate:  [44-89] 63 (05/12 0607) Resp:  [20-22] 20 (05/12 0607) BP: (94-149)/(60-89) 117/87 mmHg (05/12 0607) SpO2:  [97 %-100 %] 100 % (05/12 0607) Weight:  [170 lb 10.2 oz (77.4 kg)] 170 lb 10.2 oz (77.4 kg) (05/12 0607) Last BM Date: 02/26/14  Intake/Output from previous day:   Intake/Output this shift:    General appearance: no distress GI: nontender   Lab Results:   Recent Labs  02/25/14 2042 02/26/14 0734  WBC 11.1* 7.7  HGB 13.5 11.9*  HCT 39.3 35.9*  PLT 362 283   BMET  Recent Labs  02/25/14 2042 02/26/14 0734  NA 141 141  K 3.9 3.8  CL 102 107  CO2 26 23  GLUCOSE 108* 111*  BUN 18 14  CREATININE 0.78 0.70  CALCIUM 9.4 8.2*   PT/INR No results found for this basename: LABPROT, INR,  in the last 72 hours ABG No results found for this basename: PHART, PCO2, PO2, HCO3,  in the last 72 hours  Studies/Results: [place] Abdomen Limited Ruq  02/25/2014   CLINICAL DATA:  Right upper quadrant pain.  History of gallstones.  EXAM: US ABDOMEN LIMITED - RIGHT UPPER QUADRANT  COMPARISON:  US ABDOMEN COMPLETE dated 03/17/2009  FINDINGS: Gallbladder:  Multiple stones fill the gallbladder resulting in wall echo shadow complex. No gallbladder wall thickening. Murphy's sign is negative. Cholelithiasis has progressed since prior study.  Common bile duct:  Diameter: 7.3 mm, upper limits of normal. Distal duct is not demonstrated.  Liver:  No focal lesion identified. Within normal limits in parenchymal echogenicity.  IMPRESSION: Multiple gallstones without wall thickening or edema. Murphy's sign negative. Cholelithiasis has progressed since prior study.   Electronically Signed   By: Burman NievesWilliam  Stevens M.D.   On: 02/25/2014 23:36    Anti-infectives: Anti-infectives   Start     Dose/Rate Route Frequency Ordered Stop   02/26/14 0400  ciprofloxacin (CIPRO) IVPB 400 mg     400 mg 200 mL/hr over 60 Minutes Intravenous Every 12 hours 02/26/14 0351        Assessment/Plan: Cholecystitis  Plan lap chole today. Discussed indications/risks/benefits.  Emelia LoronMatthew Dorsel Flinn 02/26/2014

## 2014-02-27 ENCOUNTER — Encounter (HOSPITAL_COMMUNITY): Payer: Self-pay | Admitting: Cardiology

## 2014-02-27 MED ORDER — OXYCODONE-ACETAMINOPHEN 5-325 MG PO TABS: 1.0000 | ORAL_TABLET | ORAL | Status: DC | PRN

## 2014-02-27 NOTE — Progress Notes (Signed)
Pt ambulated one assist to bathroom and after trying experienced difficulty voiding.  Pt was returned to bed and bladder scanner indicated pt had 800 mL in bladder.  Pt was in/out cath'ed per MD order, putting out 900 mL with 20 mL post cath residual.  Pt educated she needs to void by 5am.  Pt understood, call light in reach, RN will continue to monitor.   Thane EduKimberly Hecker, RN

## 2014-02-27 NOTE — Discharge Summary (Signed)
Agree with above 

## 2014-02-27 NOTE — Discharge Summary (Signed)
Physician Discharge Summary  Lauren SensingSonia Mcgrath ZOX:[id number] DOB: 06/07/1981 DOA: 02/25/2014  PCP: No PCP Per Patient  Admit date: 02/25/2014 Discharge date: 02/27/2014  Recommendations for Outpatient Follow-up:  1) Follow up as indicated  Discharge Diagnoses:  Biliary colic Probable acute cholecystitis  Surgical Procedure: Laparoscopic cholecystectomy   Discharge Condition: Stable  Disposition: Discharge home  [facility]   02/26/14 0607  Weight: 170 lb 10.2 oz (77.4 kg)    Hospital Course:  33 year old female was admitted on 02/25/14 for acute RUQ pain.  CBC revealed a mild leukocytosis of 11.1 and RUQ US revealed multiple gallstones without wall thickening or edema.  Patient was placed on IV Cipro and after discussion of risks/benefits underwent Laparoscopic cholecystectomy. Patient did well postoperatively and was doing well at time of discharge. She was voiding and tolerating a regular diet with good pain control.    Physical Exam: General appearance: well appearing, NAD.  Abdomen: soft, appropriately tender, nondistended. + BS. Incisions c/d/i with steri-strips present  Discharge Instructions    Medication List         indomethacin 25 MG capsule  Commonly known as:  INDOCIN  Take 25 mg by mouth 3 (three) times daily as needed for mild pain.     oxyCODONE-acetaminophen 5-325 MG per tablet  Commonly known as:  PERCOCET/ROXICET  Take 1-2 tablets by mouth every 4 (four) hours as needed for severe pain.       Follow-up Information   Follow up with [facility] On 03/19/2014. (1:30pm, arrive at 1:00pm for paperwork)    Contact information:   [address]   [place] [address] [phone]      The results of significant diagnostics from this hospitalization (including imaging, microbiology, ancillary and laboratory) are listed below for reference.    Significant Diagnostic Studies: [place] Abdomen Limited Ruq  02/25/2014   CLINICAL DATA:  Right  upper quadrant pain.  History of gallstones.  EXAM: US ABDOMEN LIMITED - RIGHT UPPER QUADRANT  COMPARISON:  US ABDOMEN COMPLETE dated 03/17/2009  FINDINGS: Gallbladder:  Multiple stones fill the gallbladder resulting in wall echo shadow complex. No gallbladder wall thickening. Murphy's sign is negative. Cholelithiasis has progressed since prior study.  Common bile duct:  Diameter: 7.3 mm, upper limits of normal. Distal duct is not demonstrated.  Liver:  No focal lesion identified. Within normal limits in parenchymal echogenicity.  IMPRESSION: Multiple gallstones without wall thickening or edema. Murphy's sign negative. Cholelithiasis has progressed since prior study.   Electronically Signed   By: Burman NievesWilliam  Stevens M.D.   On: 02/25/2014 23:36   Labs: Basic Metabolic Panel:  Recent Labs Lab 02/25/14 2042 02/26/14 0734  NA 141 141  K 3.9 3.8  CL 102 107  CO2 26 23  GLUCOSE 108* 111*  BUN 18 14  CREATININE 0.78 0.70  CALCIUM 9.4 8.2*   Liver Function Tests:  Recent Labs Lab 02/25/14 2042 02/26/14 0734  AST 21 12  ALT 14 11  ALKPHOS 74 55  BILITOT <0.2* 0.2*  PROT 7.8 5.9*  ALBUMIN 4.4 3.2*    Recent Labs Lab 02/25/14 2042  LIPASE 22   CBC:  Recent Labs Lab 02/25/14 2042 02/26/14 0734  WBC 11.1* 7.7  NEUTROABS 6.9  --   HGB 13.5 11.9*  HCT 39.3 35.9*  MCV 88.9 90.2  PLT 362 283   Time coordinating discharge: 30 mins  Jayce [facility] CenterCook DO Family Medicine PGY-2 Pager #: [phone]  Agree with  above.  Additions made as needed Letha CapeKelly E Galia Rahm 9:28 AM

## 2014-02-27 NOTE — Discharge Instructions (Signed)
CIRUGIA LAPAROSCOPICA: INSTRUCCIONES DE POST OPERATORIO. ° °Revise siempre los documentos que le entreguen en el lugar donde se ha hecho la cirugia. ° °SI USTED NECESITA DOCUMENTOS DE INCAPACIDAD (DISABLE) O DE PERMISO FAMILAR (FAMILY LEAVE) NECESITA TRAERLOS A LA OFICINA PARA QUE SEAN PROCESADOS. °NO  SE LOS DE A SU DOCTOR. °1. A su alta del hospital se le dara una receta para controlar el dolor. Tomela como ha sido recetada, si la necesita. Si no la necesita puede tomar, Acetaminofen (Tylenol) o Ibuprofen (Advil) para aliviar dolor moderado. °2. Continue tomando el resto de sus medicinas. °3. Si necesita rellenar la receta, llame a la farmacia. ellos contactan a nuestra oficina pidiendo autorizacion. Este tipo de receta no pueden ser rellenadas despues de las  5pm o durante los fines de semana. °4. Con relacion a la dieta: debe ser ligera los primeros dias despues que llege a la casa. Ejemplo: sopas y galleticas. Tome bastante liquido esos dias. °5. La mayoria de los pacientes padecen de inflamacion y cambio de coloracion de la piel alrededor de las incisiones. esto toma dias en resolver.  pnerse una bolsa de hielo en el area affectada ayuda..  °6. Es comun tambien tener un poco de estrenimiento si esta tomado medicinas para el dolor. incremente la cantidad de liquidos a tomar y puede tomar (Colace) esto previene el problema. Si ya tiene estrenimiento, es decir no ha defecado en 48 horas, puede tomar un laxativo (Milk of Magnesia or Miralax) uselo como el paquete le explica. °7.  A menos que se le diga algo diferente. Remueva el bendaje a las 24-48 horas despues dela cirugia. y puede banarse en la ducha sin ningun problema. usted puede tener steri-strips (pequenas curitas transparentes en la piel puesta encima de la incision)  Estas banditas strips should be left on the skin for 7-10 days.   Si su cirujano puso pegamento encima de la incision usted puede banarse bajo la ducha en 24 horas. Este pegamento empezara a  caerse en las proximas 2-3 semanas. Si le pusieron suturas o presillas (grapos) estos seran quitados en su proxima cita en la oficina. . °a. ACTIVIDADES:  Puede hacer actividad ligera.  Como caminar , subir escaleras y poco a poco irlas incrementando tanto como las tolere. Puede tener relaciones sexuales cuando sea comfortable. No carge objetos pesados o haga esfuerzos que no sean aprovados por su doctor. °b. Puede manejar en cuanto no esta tomando medicamentos fuertes (narcoticos) para el dolor, pueda abrochar confortablemente el cinturon de seguridad, y pueda maniobrar y usar los pedales de su vehiculo con seguridad. °c. PUEDE REGRESAR A TRABAJAR  °8. Debe ver a su doctor para una cita de seguimiento en 2-3 semanas despues de la cirugia.  °9. OTRAS ISNSTRUCCIONES:___________________________________________________________________________________ °CUANDO LLAMAR A SU MEDICO: °1. FIEBRE mayor de  101.0 °2. No produccion de orina. °3. Sangramiento continue de la herida °4. Incremento de dolor, enrojecimientio o drenaje de la herida (incision) °5. Incremento de dolor abdominal. ° °The clinic staff is available to answer your questions during regular business hours.  Please don’t hesitate to call and ask to speak to one of the nurses for clinical concerns.  If you have a medical emergency, go to the nearest emergency room or call 911.  A surgeon from [facility] is always on call at the hospital. °[address], [address], [place], [place]  [address] ? [address] [address], [place], [place]   [address] °[phone] ? [phone] ? FAX [phone] °Web site: [email or web address] ° ° °

## 2014-02-27 NOTE — Progress Notes (Signed)
IV d/c at this time; pt given d/c instructions; prescriptions and follow up appointments given; will cont. To monitor.

## 2014-03-01 ENCOUNTER — Encounter (HOSPITAL_COMMUNITY): Payer: Self-pay | Admitting: General Surgery

## 2014-03-19 ENCOUNTER — Encounter (INDEPENDENT_AMBULATORY_CARE_PROVIDER_SITE_OTHER): Payer: Self-pay

## 2014-03-19 ENCOUNTER — Ambulatory Visit (INDEPENDENT_AMBULATORY_CARE_PROVIDER_SITE_OTHER): Payer: Self-pay | Admitting: General Surgery

## 2014-03-19 VITALS — BP 110/75 | HR 68 | Temp 98.4°F | Resp 14 | Ht 63.0 in | Wt 166.6 lb

## 2014-03-19 DIAGNOSIS — K8010 Calculus of gallbladder with chronic cholecystitis without obstruction: Secondary | ICD-10-CM

## 2014-03-19 NOTE — Progress Notes (Signed)
Lauren Mcgrath September 29, 1981 [id number] 03/19/2014   Lauren Mcgrath is a 33 y.o. female who had a laparoscopic cholecystectomy with intraoperative cholangiogram by Dr. Emelia Loron.  The pathology report confirmed:  Gallbladder - CHRONIC CHOLECYSTITIS AND CHOLELITHIASIS. - THERE IS NO EVIDENCE OF MALIGNANCY..  The patient reports that they are feeling well with normal bowel movements and good appetite.  The pre-operative symptoms of abdominal pain, nausea, and vomiting have resolved.    Physical examination - BP 110/75  Pulse 68  Temp(Src) 98.4 F (36.9 C)  Resp 14  Ht 5\' 3"  (1.6 m)  Wt 75.569 kg (166 lb 9.6 oz)  BMI 29.52 kg/m2  LMP 02/24/2014  Incisions appear well-healed with no sign of infection or bleeding.   Abdomen - soft, non-tender Sore pain RUQ, not tender to palpation, soft, + BS, tolerating PO's well, normal BM. She has a 1-2 mm thrombosed vein anti cubital space right arm.   Impression:  s/p laparoscopic cholecystectomy  Plan:  She may resume a regular diet and full activity.  She may follow-up on a PRN basis.

## 2014-03-19 NOTE — Patient Instructions (Addendum)
Call us again if you have an issue.  You can take ibuprofen for pain and soreness as needed.  2-3 tablets every 6 hours as needed. Colecistectoma laparoscpica - Cuidados posteriores (Laparoscopic Cholecystectomy, Care After) Siga estas instrucciones durante las prximas semanas. Estas indicaciones le proporcionan informacin general acerca de cmo deber cuidarse despus del procedimiento. El mdico tambin podr darle instrucciones ms especficas. El tratamiento ha sido planificado segn las prcticas mdicas actuales, pero en algunos casos pueden ocurrir problemas. Comunquese con el mdico si tiene algn problema o tiene dudas despus del procedimiento. QU ESPERAR DESPUS DEL PROCEDIMIENTO Despus del procedimiento, es comn tener las siguientes sensaciones:  Dolor en los lugares de la incisin. Le darn analgsicos para Human resources officer.  Nuseas o vmitos leves. Estos sntomas deberan mejorar despus de las primeras 24horas.  Meteorismo y Designer, fashion/clothing en el hombro debido al gas que se [place] durante el procedimiento. Estos sntomas mejorarn despus de las primeras 24horas. INSTRUCCIONES PARA EL CUIDADO EN EL HOGAR   Cambie los apsitos (vendajes) tal como le indic el mdico.  Mantenga la herida limpia y seca. Puede lavar la herida suavemente con agua y [place]. Seque dando palmaditas suaves.  No se bae en la baera, no practique natacin ni use el jacuzzy durante 2semanas o hasta que lo autorice el mdico.  [place] solo medicamentos de venta libre o recetados, segn las indicaciones del mdico.  Siga su dieta normal segn las indicaciones de su mdico.  No levante ningn objeto que pese ms de 10libras (4,5kg) hasta que el mdico lo autorice.  No practique deportes de contacto durante 1semana o hasta que el mdico lo autorice. SOLICITE ATENCIN MDICA SI:   Presenta enrojecimiento, hinchazn o aumento del dolor en la herida.  Observa una secrecin de color blanco  amarillento (pus) en la herida.  Hay una secrecin en la herida que dura ms de 1da.  Advierte un olor ftido que proviene de la herida o del vendaje.  Los cortes quirrgicos (incisiones) se abren. SOLICITE ATENCIN MDICA DE INMEDIATO SI:   Le aparece una erupcin cutnea.  Tiene dificultad para respirar.  Siente dolor en el pecho.  Tiene fiebre.  Nota un incremento del dolor en los hombros (en la zona donde van los breteles).  Presenta episodios de mareos o se siente dbil cuando est de pie.  Siente un dolor abdominal intenso.  Tiene Programme researcher, broadcasting/film/video (nuseas) o vomita y esto dura ms de 1da. Document Released: 05/17/2011 Document Revised: 07/25/2013 [facility] Patient Information 2014 [place], [place].

## 2016-07-03 ENCOUNTER — Emergency Department (HOSPITAL_COMMUNITY): Payer: Self-pay

## 2016-07-03 ENCOUNTER — Emergency Department (HOSPITAL_COMMUNITY)
Admission: EM | Admit: 2016-07-03 | Discharge: 2016-07-03 | Disposition: A | Discharge status: Home / Self Care | Payer: Self-pay | Attending: Emergency Medicine | Admitting: Emergency Medicine

## 2016-07-03 ENCOUNTER — Encounter (HOSPITAL_COMMUNITY): Payer: Self-pay | Admitting: Emergency Medicine

## 2016-07-03 DIAGNOSIS — R1010 Upper abdominal pain, unspecified: Secondary | ICD-10-CM

## 2016-07-03 DIAGNOSIS — K2980 Duodenitis without bleeding: Secondary | ICD-10-CM | POA: Insufficient documentation

## 2016-07-03 LAB — URINALYSIS, ROUTINE W REFLEX MICROSCOPIC
BILIRUBIN URINE: NEGATIVE
Glucose, UA: NEGATIVE mg/dL
Hgb urine dipstick: NEGATIVE
KETONES UR: NEGATIVE mg/dL
Leukocytes, UA: NEGATIVE
Nitrite: NEGATIVE
PH: 5 (ref 5.0–8.0)
Protein, ur: NEGATIVE mg/dL
Specific Gravity, Urine: 1.02 (ref 1.005–1.030)

## 2016-07-03 LAB — COMPREHENSIVE METABOLIC PANEL
ALBUMIN: 4.4 g/dL (ref 3.5–5.0)
ALT: 16 U/L (ref 14–54)
AST: 18 U/L (ref 15–41)
Alkaline Phosphatase: 62 U/L (ref 38–126)
Anion gap: 9 (ref 5–15)
BUN: 13 mg/dL (ref 6–20)
CHLORIDE: 103 mmol/L (ref 101–111)
CO2: 25 mmol/L (ref 22–32)
CREATININE: 0.75 mg/dL (ref 0.44–1.00)
Calcium: 9.4 mg/dL (ref 8.9–10.3)
GFR calc Af Amer: >60 mL/min (ref >60)
GLUCOSE: 105 mg/dL — AB (ref 65–99)
Potassium: 4 mmol/L (ref 3.5–5.1)
Sodium: 137 mmol/L (ref 135–145)
Total Bilirubin: 0.5 mg/dL (ref 0.3–1.2)
Total Protein: 7.3 g/dL (ref 6.5–8.1)

## 2016-07-03 LAB — LIPASE, BLOOD: LIPASE: 27 U/L (ref 11–51)

## 2016-07-03 LAB — CBC
HCT: 36.2 % (ref 36.0–46.0)
Hemoglobin: 12 g/dL (ref 12.0–15.0)
MCH: 29.9 pg (ref 26.0–34.0)
MCHC: 33.1 g/dL (ref 30.0–36.0)
MCV: 90.3 fL (ref 78.0–100.0)
PLATELETS: 353 10*3/uL (ref 150–400)
RBC: 4.01 MIL/uL (ref 3.87–5.11)
RDW: 13.2 % (ref 11.5–15.5)
WBC: 10.6 10*3/uL — AB (ref 4.0–10.5)

## 2016-07-03 LAB — POC URINE PREG, ED: Preg Test, Ur: NEGATIVE

## 2016-07-03 MED ORDER — IOPAMIDOL (ISOVUE-300) INJECTION 61%
INTRAVENOUS | Status: AC
  Administered 2016-07-03: 100 mL
  Filled 2016-07-03: qty 100

## 2016-07-03 MED ORDER — SODIUM CHLORIDE 0.9 % IV BOLUS (SEPSIS)
1000.0000 mL | Freq: Once | INTRAVENOUS | Status: AC
  Administered 2016-07-03: 1000 mL via INTRAVENOUS

## 2016-07-03 MED ORDER — OXYCODONE-ACETAMINOPHEN 5-325 MG PO TABS
1.0000 | ORAL_TABLET | Freq: Once | ORAL | Status: AC
  Administered 2016-07-03: 1 via ORAL

## 2016-07-03 MED ORDER — OXYCODONE-ACETAMINOPHEN 5-325 MG PO TABS
ORAL_TABLET | ORAL | Status: AC
  Filled 2016-07-03: qty 1

## 2016-07-03 MED ORDER — KETOROLAC TROMETHAMINE 30 MG/ML IJ SOLN
30.0000 mg | Freq: Once | INTRAMUSCULAR | Status: AC
  Administered 2016-07-03: 30 mg via INTRAVENOUS
  Filled 2016-07-03: qty 1

## 2016-07-03 MED ORDER — OXYCODONE-ACETAMINOPHEN 5-325 MG PO TABS: 1.0000 | ORAL_TABLET | ORAL | 0 refills | Status: AC | PRN

## 2016-07-03 MED ORDER — PANTOPRAZOLE SODIUM 40 MG PO TBEC: 40.0000 mg | DELAYED_RELEASE_TABLET | Freq: Two times a day (BID) | ORAL | 0 refills | Status: AC

## 2016-07-03 NOTE — ED Notes (Addendum)
PT.COMPLAINS ABOUT PAIN ON RIGHT SIDE OF HER STOMACH.REPORT TO DAVID RN -CHARGE.

## 2016-07-03 NOTE — ED Provider Notes (Signed)
[facility]-EMERGENCY DEPT Provider Note   CSN: [id number] Arrival date & time: 07/03/16  4098     History   Chief Complaint Chief Complaint  Patient presents with  . Abdominal Pain    HPI Lauren Mcgrath is a 35 y.o. female presenting with abdominal pain. History taken with the help of the Spanish interpreter. Patient developed pain about 1 week ago in her right upper abdomen. There is always some level of pain but then it waxes and wanes. She has a hard time describing exactly what it feels like. Occasionally patent radiates up to her neck and also to her left side. Some back pain. Vomited once a couple days ago and it was green after a severe bout of pain. Has had diarrhea for the last 2 months. No fevers, cough. Occasionally her urine burns area and given a Percocet in the waiting room with partial relief. She has tried omeprazole with no relief. This feels somewhat similar to when she had her gallbladder removed 2 years ago. Her abdomen occasionally feels bloated and swollen.  HPI  History reviewed. No pertinent past medical history.  Patient Active Problem List   Diagnosis Date Noted  . Biliary colic 02/26/2014    Past Surgical History:  Procedure Laterality Date  . CHOLECYSTECTOMY N/A 02/26/2014   Procedure: LAPAROSCOPIC CHOLECYSTECTOMY;  Surgeon: Emelia Loron, MD;  Location: [facility];  Service: General;  Laterality: N/A;    OB History    No data available       Home Medications    Prior to Admission medications   Medication Sig Start Date End Date Taking? Authorizing Provider  oxyCODONE-acetaminophen (PERCOCET) 5-325 MG tablet Take 1 tablet by mouth every 4 (four) hours as needed for severe pain. 07/03/16   Pricilla Loveless, MD  pantoprazole (PROTONIX) 40 MG tablet Take 1 tablet (40 mg total) by mouth 2 (two) times daily. 07/03/16   Pricilla Loveless, MD    Family History No family history on file.  Social History Social History  Substance Use Topics  . Smoking status:  Never Smoker  . Smokeless tobacco: Never Used  . Alcohol use Yes     Allergies   Review of patient's allergies indicates no known allergies.   Review of Systems Review of Systems  Constitutional: Negative for fever.  Respiratory: Negative for shortness of breath.   Cardiovascular: Negative for chest pain.  Gastrointestinal: Positive for abdominal distention, abdominal pain, diarrhea and vomiting.  Genitourinary: Positive for dysuria. Negative for vaginal bleeding and vaginal discharge.  Musculoskeletal: Positive for back pain.  All other systems reviewed and are negative.    Physical Exam Updated Vital Signs BP 102/57   Pulse (!) 53   Temp 98 F (36.7 C) (Oral)   Resp 17   Ht 5\' 2"  (1.575 m)   Wt 157 lb (71.2 kg)   LMP 06/27/2016 (Within Days)   SpO2 97%   BMI 28.72 kg/m   Physical Exam  Constitutional: She is oriented to person, place, and time. She appears well-developed and well-nourished.  HENT:  Head: Normocephalic and atraumatic.  Right Ear: External ear normal.  Left Ear: External ear normal.  Nose: Nose normal.  Eyes: Right eye exhibits no discharge. Left eye exhibits no discharge.  Cardiovascular: Normal rate, regular rhythm and normal heart sounds.   Pulmonary/Chest: Effort normal and breath sounds normal. She exhibits tenderness.    Abdominal: Soft. There is tenderness in the right upper quadrant.  Neurological: She is alert and oriented to person, place, and  time.  Skin: Skin is warm and dry.  Nursing note and vitals reviewed.    ED Treatments / Results  Labs (all labs ordered are listed, but only abnormal results are displayed) Labs Reviewed  COMPREHENSIVE METABOLIC PANEL - Abnormal; Notable for the following:       Result Value   Glucose, Bld 105 (*)    All other components within normal limits  CBC - Abnormal; Notable for the following:    WBC 10.6 (*)    All other components within normal limits  LIPASE, BLOOD  URINALYSIS, ROUTINE W  REFLEX MICROSCOPIC (NOT [facility])  POC URINE PREG, ED    EKG  EKG Interpretation None       Radiology Ct Abdomen Pelvis W Contrast  Result Date: 07/03/2016 CLINICAL DATA:  Patient with nausea and vomiting for 1 week. Diffuse abdominal pain. EXAM: CT ABDOMEN AND PELVIS WITH CONTRAST TECHNIQUE: Multidetector CT imaging of the abdomen and pelvis was performed using the standard protocol following bolus administration of intravenous contrast. CONTRAST:  100mL ISOVUE-300 IOPAMIDOL (ISOVUE-300) INJECTION 61% COMPARISON:  None. FINDINGS: Lower chest: Normal heart size. Minimal atelectasis within the lower lobes bilaterally. No large area of pulmonary consolidation. No pleural effusion. Hepatobiliary: Liver is normal in size and contour. No focal lesion identified. Patient status post cholecystectomy. Pancreas: Unremarkable Spleen: Unremarkable Adrenals/Urinary Tract: Normal adrenal glands. Kidneys enhance symmetrically with contrast. No hydronephrosis. Urinary bladder is unremarkable. Stomach/Bowel: Suggestion of mild wall thickening of the descending duodenum (image 64; series 5). No evidence for bowel obstruction. No free fluid or free intraperitoneal air. Normal morphology of the stomach. Normal appendix. Vascular/Lymphatic: Normal caliber abdominal aorta. No retroperitoneal lymphadenopathy. Reproductive: Uterus and adnexal structures unremarkable. Other: None. Musculoskeletal: There is a 2 cm sclerotic lesion within the left pubic ramus (image 85; series 2), nonspecific. IMPRESSION: Suggestion of mild wall thickening of the duodenum as can be seen with duodenitis. No evidence for obstruction Nonspecific 2 cm sclerotic lesion within the left pubic ramus. Recommend follow-up pelvic radiographs in 3 months to ensure stability. Electronically Signed   By: Annia Beltrew  Davis M.D.   On: 07/03/2016 10:03    Procedures Procedures (including critical care time)  Medications Ordered in ED Medications    oxyCODONE-acetaminophen (PERCOCET/ROXICET) 5-325 MG per tablet (not administered)  oxyCODONE-acetaminophen (PERCOCET/ROXICET) 5-325 MG per tablet 1 tablet (1 tablet Oral Given 07/03/16 0112)  ketorolac (TORADOL) 30 MG/ML injection 30 mg (30 mg Intravenous Given 07/03/16 0819)  sodium chloride 0.9 % bolus 1,000 mL (1,000 mLs Intravenous New Bag/Given 07/03/16 0819)  iopamidol (ISOVUE-300) 61 % injection (100 mLs  Contrast Given 07/03/16 0926)     Initial Impression / Assessment and Plan / ED Course  I have reviewed the triage vital signs and the nursing notes.  Pertinent labs & imaging results that were available during my care of the patient were reviewed by me and considered in my medical decision making (see chart for details).  Clinical Course  Comment By Time  Will get CT to further workup. Currently labs appear benign. No PE risk factors, and while her pain is pleuritic she is low risk with 100% O2 sats, no tachycardia, and is PERC negative Pricilla LovelessScott Mkenzie Dotts, MD 09/16 0731  Pain is improved. Will treat with PPI BID, short course norco. F/u with GI. Discussed return precautions with interpreter assistance Pricilla LovelessScott Joel Cowin, MD 09/16 1027    Final Clinical Impressions(s) / ED Diagnoses   Final diagnoses:  Pain of upper abdomen  Duodenitis    New  Prescriptions New Prescriptions   OXYCODONE-ACETAMINOPHEN (PERCOCET) 5-325 MG TABLET    Take 1 tablet by mouth every 4 (four) hours as needed for severe pain.   PANTOPRAZOLE (PROTONIX) 40 MG TABLET    Take 1 tablet (40 mg total) by mouth 2 (two) times daily.     Pricilla Loveless, MD 07/03/16 1031

## 2016-07-03 NOTE — ED Triage Notes (Signed)
Pt arrives with R upper abdominal pain and sore throat ongoing x1 week, more severe today. States vomiting yesterday, pt attributes to pain. Diarrhea x2. Omeprazole not effective. Describes as "severe" 8/10.   Triage completed with [employer] video interpreting services.

## 2016-07-03 NOTE — ED Notes (Signed)
Family at bedside. 

## 2017-05-08 IMAGING — CT CT ABD-PELV W/ CM
2 of 4 series · 15 of 46 positions shown, 17 images · IV contrast (Omni 300)
Comparison: None.

CLINICAL DATA: Patient with nausea and vomiting for 1 week. Diffuse
abdominal pain.

EXAM:
CT ABDOMEN AND PELVIS WITH CONTRAST
TECHNIQUE: Multidetector CT imaging of the abdomen and pelvis was performed
using the standard protocol following bolus administration of
intravenous contrast.
CONTRAST:  100mL TL1H75-1PP IOPAMIDOL (TL1H75-1PP) INJECTION 61%

[Series 2: a/p w/ 5mm · axial · 0.66mm/px · z∈[+788,+1218]mm · 12 of 94 slices shown, 14 images]
[im 4/94  soft-tissue]
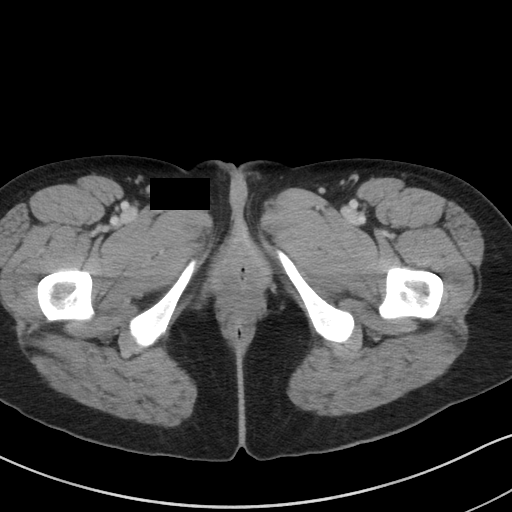
[im 4/94  bone]
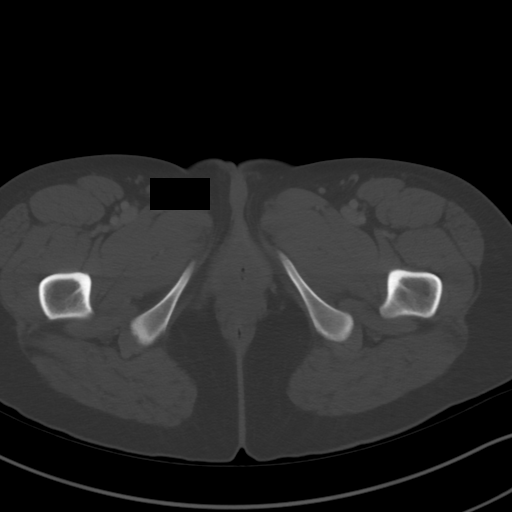
[im 12/94  soft-tissue]
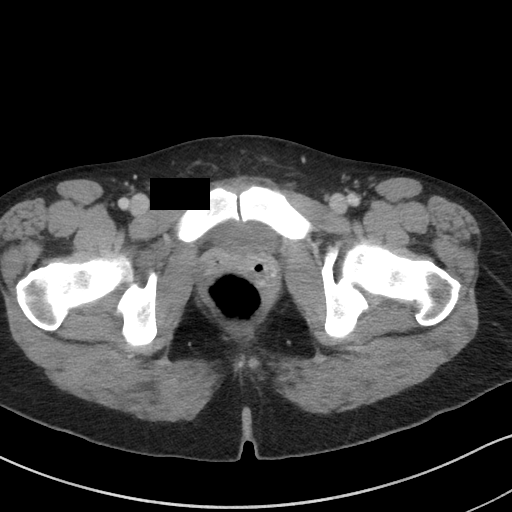
[im 20/94  soft-tissue]
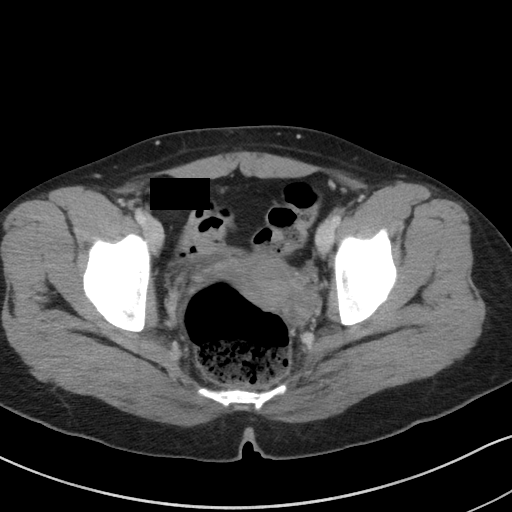
[im 28/94  soft-tissue]
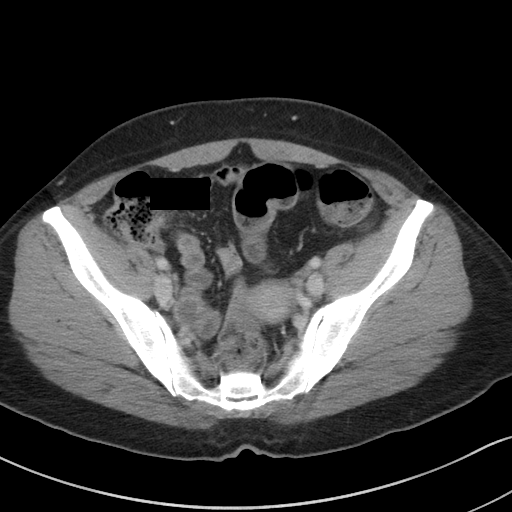
[im 35/94  soft-tissue]
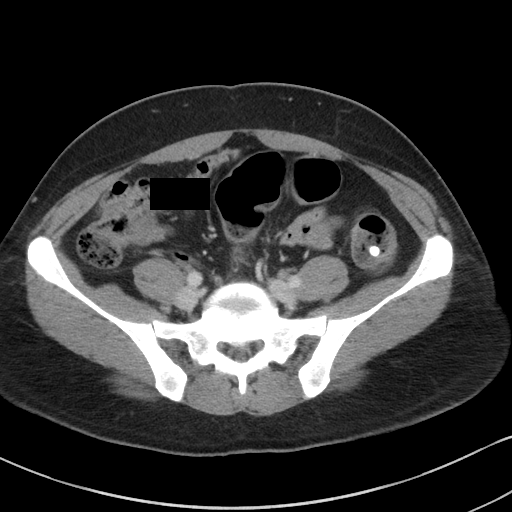
[im 43/94  soft-tissue]
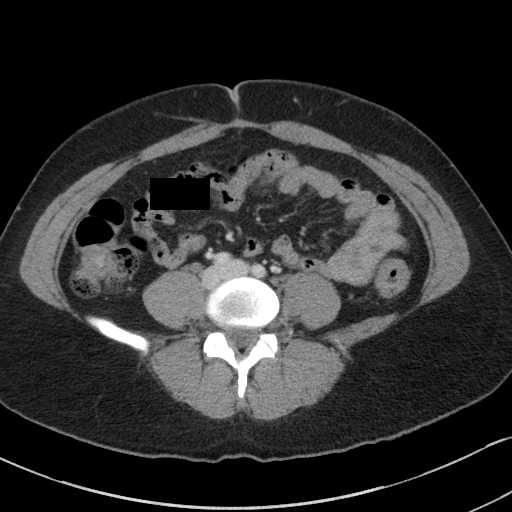
[im 51/94  soft-tissue]
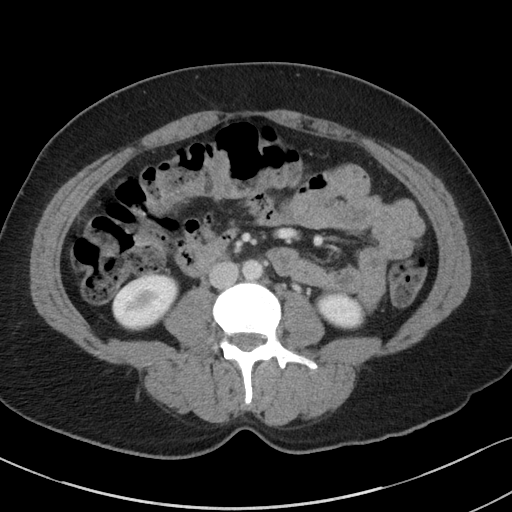
[im 59/94  soft-tissue]
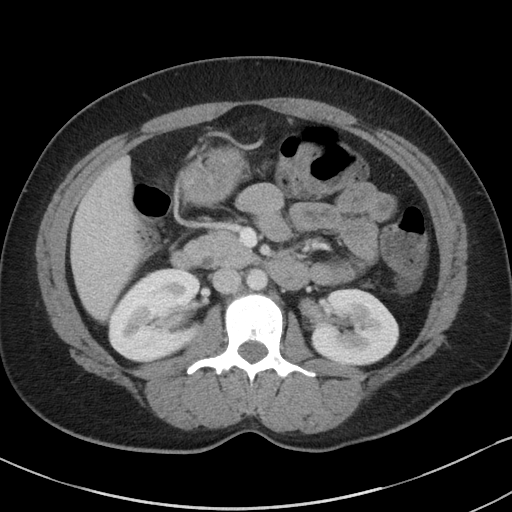
[im 66/94  soft-tissue]
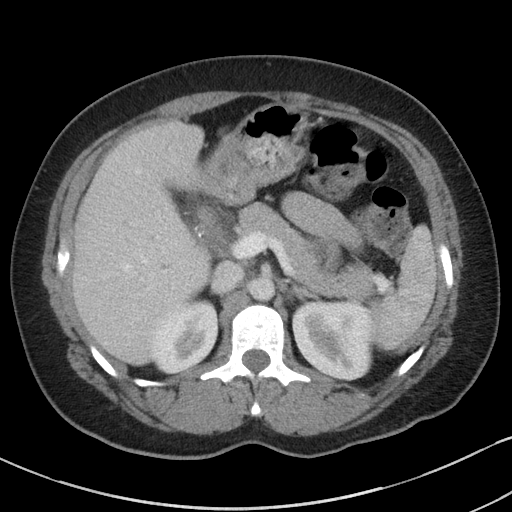
[im 66/94  bone]
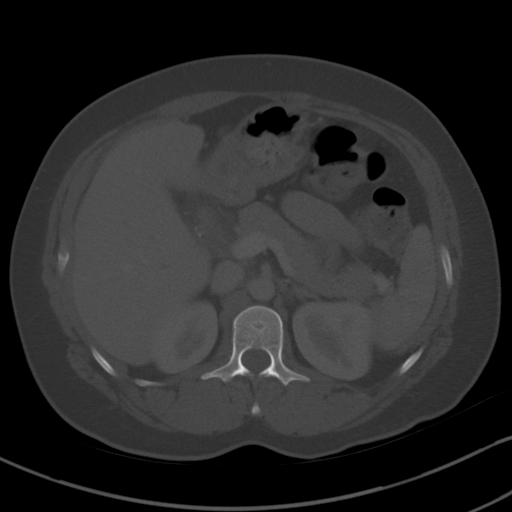
[im 74/94  soft-tissue]
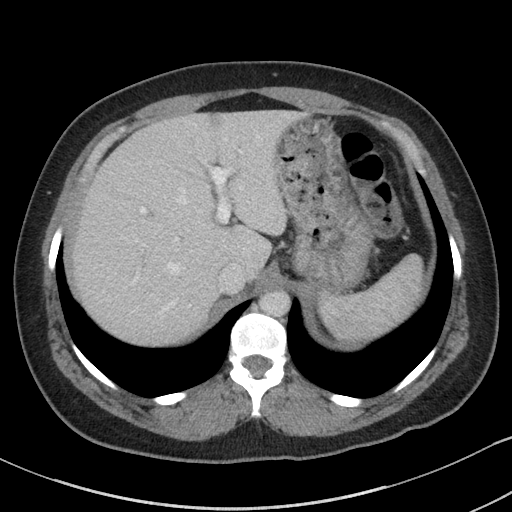
[im 82/94  soft-tissue]
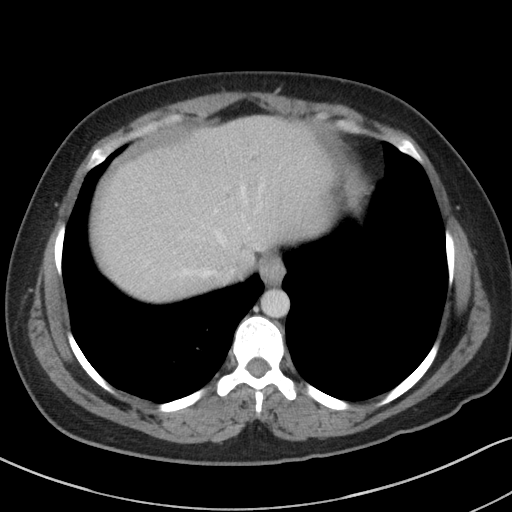
[im 90/94  soft-tissue]
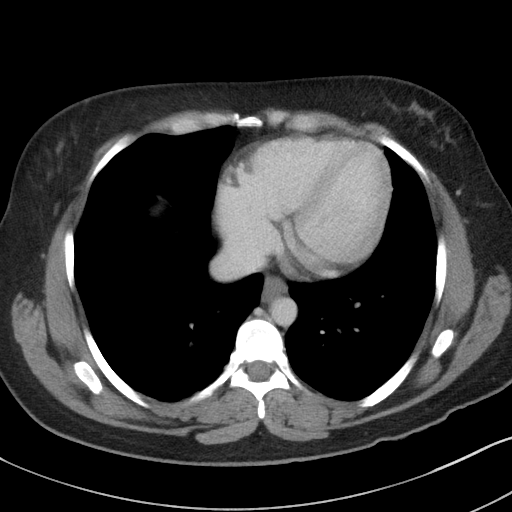

[Series 5: a/p w/ cor · coronal · 0.57mm/px · 3 of 151 slices shown]
[im 51/151  soft-tissue]
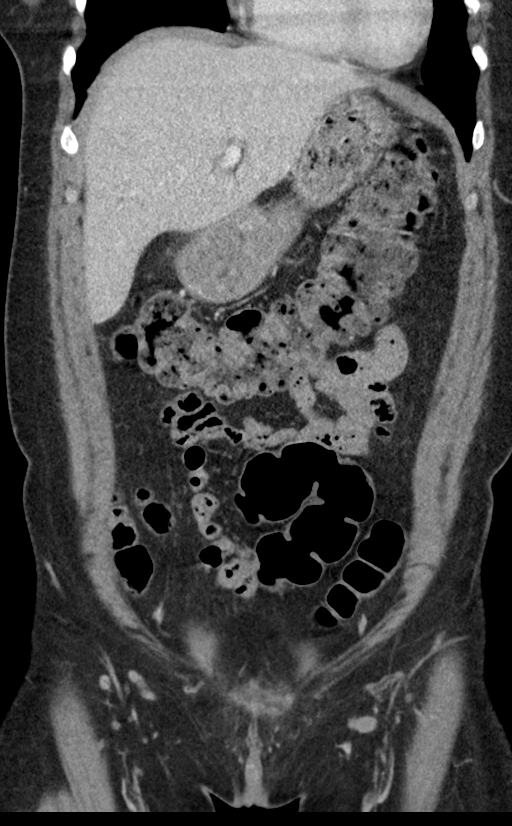
[im 67/151  soft-tissue]
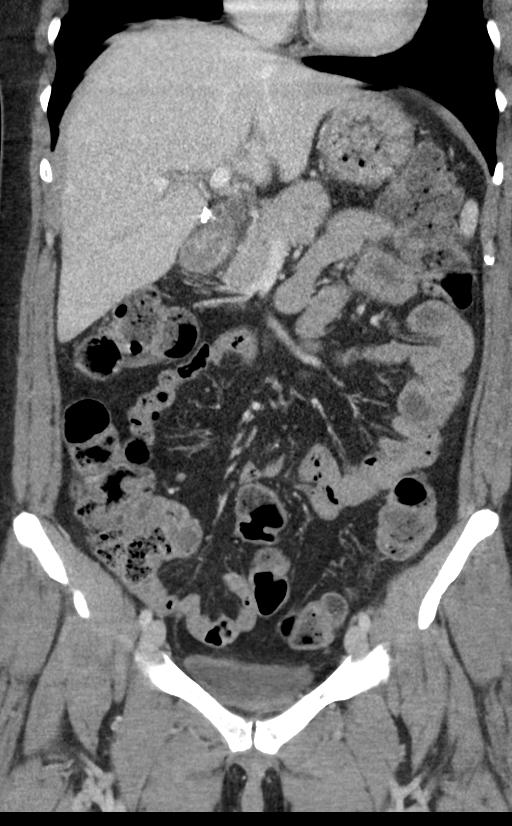
[im 84/151  soft-tissue]
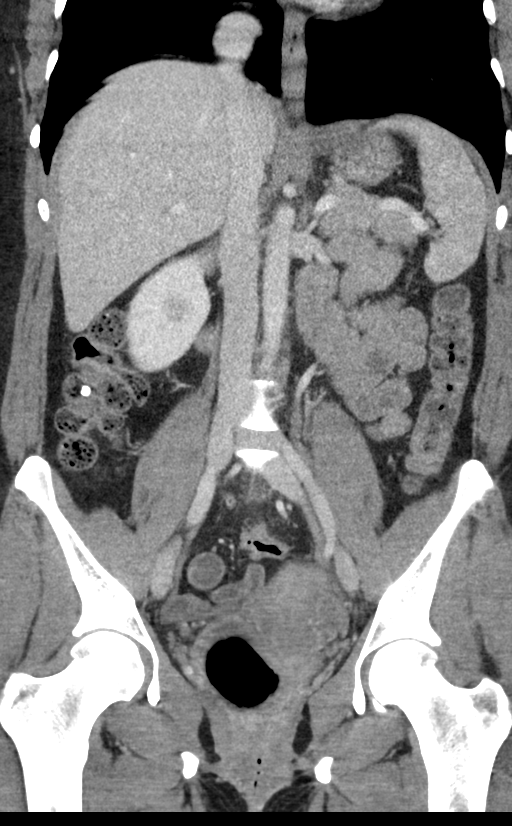

[15 of 46 positions shown; findings below may reference images not displayed]

FINDINGS: Lower chest: Normal heart size. Minimal atelectasis within the lower
lobes bilaterally. No large area of pulmonary consolidation. No
pleural effusion.

Hepatobiliary: Liver is normal in size and contour. No focal lesion
identified. Patient status post cholecystectomy.

Pancreas: Unremarkable

Spleen: Unremarkable

Adrenals/Urinary Tract: Normal adrenal glands. Kidneys enhance
symmetrically with contrast. No hydronephrosis. Urinary bladder is
unremarkable.

Stomach/Bowel: Suggestion of mild wall thickening of the descending
duodenum (image 64; series 5). No evidence for bowel obstruction. No
free fluid or free intraperitoneal air. Normal morphology of the
stomach. Normal appendix.

Vascular/Lymphatic: Normal caliber abdominal aorta. No
retroperitoneal lymphadenopathy.

Reproductive: Uterus and adnexal structures unremarkable.

Other: None.

Musculoskeletal: There is a 2 cm sclerotic lesion within the left
pubic ramus (image 85; series 2), nonspecific.
IMPRESSION: Suggestion of mild wall thickening of the duodenum as can be seen
with duodenitis. No evidence for obstruction

Nonspecific 2 cm sclerotic lesion within the left pubic ramus.
Recommend follow-up pelvic radiographs in 3 months to ensure
stability.

## 2017-10-25 ENCOUNTER — Ambulatory Visit: Payer: Self-pay | Admitting: Emergency Medicine
